# Patient Record
Sex: Female | Born: 1975 | Race: White | Hispanic: No | Marital: Married
Health system: Southern US, Community
[De-identification: ages and names within clinical notes are randomized; demographics above are authoritative.]

---

## 2020-06-29 ENCOUNTER — Other Ambulatory Visit: Payer: Self-pay

## 2020-06-29 ENCOUNTER — Ambulatory Visit: Payer: Managed Care, Other (non HMO) | Admitting: Family Medicine

## 2020-06-29 ENCOUNTER — Encounter: Payer: Self-pay | Admitting: Family Medicine

## 2020-06-29 ENCOUNTER — Ambulatory Visit: Payer: Self-pay

## 2020-06-29 VITALS — BP 120/74 | HR 83 | Ht 66.0 in | Wt 139.0 lb

## 2020-06-29 DIAGNOSIS — S82154A Nondisplaced fracture of right tibial tuberosity, initial encounter for closed fracture: Secondary | ICD-10-CM

## 2020-06-29 DIAGNOSIS — M25561 Pain in right knee: Secondary | ICD-10-CM | POA: Diagnosis not present

## 2020-06-29 DIAGNOSIS — S82151A Displaced fracture of right tibial tuberosity, initial encounter for closed fracture: Secondary | ICD-10-CM | POA: Insufficient documentation

## 2020-06-29 MED ORDER — VITAMIN D (ERGOCALCIFEROL) 1.25 MG (50000 UNIT) PO CAPS
50000.0000 [IU] | ORAL_CAPSULE | ORAL | 0 refills | Status: DC
Start: 2020-06-29 — End: 2023-10-14

## 2020-06-29 NOTE — Assessment & Plan Note (Signed)
I believe the patient does have a cortical irregularity that is more consistent with a stress reaction to the tibial tuberosity.  Once weekly vitamin D, patellar strap, decrease high impact exercises follow-up in 6 weeks to further evaluate.  Worsening pain will need to consider the possibility of MRI but I do not think that this will be likely.

## 2020-06-29 NOTE — Progress Notes (Signed)
[name] [address] [place] [address] Phone: [phone] Subjective:   Bruce Donath, am serving as a scribe for Dr. Antoine Primas. This visit occurred during the SARS-CoV-2 public health emergency.  Safety protocols were in place, including screening questions prior to the visit, additional usage of staff PPE, and extensive cleaning of exam room while observing appropriate contact time as indicated for disinfecting solutions.   I'm seeing this patient by the request  of:  Haimes, Teena Irani, MD  CC: Knee pain  [id number]  [place] Cynthia Blake is a 44 y.o. female coming in with complaint of right knee pain over tibial tuberosity. Was doing a lot of burpies and may have hit her knee on floor. Pain when she pushes on bone and squats. Patient saw another provider and was placed on celebrex. Did have a cortisone injection from another provider.  Patient states that the injection was not helpful at all.   Social History   Socioeconomic History  . Marital status: Married    Spouse name: Not on file  . Number of children: Not on file  . Years of education: Not on file  . Highest education level: Not on file  Occupational History  . Not on file  Tobacco Use  . Smoking status: Not on file  Substance and Sexual Activity  . Alcohol use: Not on file  . Drug use: Not on file  . Sexual activity: Not on file  Other Topics Concern  . Not on file  Social History Narrative  . Not on file   Social Determinants of Health   Financial Resource Strain:   . Difficulty of Paying Living Expenses:   Food Insecurity:   . Worried About Programme researcher, broadcasting/film/video in the Last Year:   . Barista in the Last Year:   Transportation Needs:   . Freight forwarder (Medical):   Marland Kitchen Lack of Transportation (Non-Medical):   Physical Activity:   . Days of Exercise per Week:   . Minutes of Exercise per Session:   Stress:   . Feeling of Stress :   Social Connections:     . Frequency of Communication with Friends and Family:   . Frequency of Social Gatherings with Friends and Family:   . Attends Religious Services:   . Active Member of Clubs or Organizations:   . Attends Banker Meetings:   Marland Kitchen Marital Status:    Not on File No family history on file.       Current Outpatient Medications (Other):  Marland Kitchen  Vitamin D, Ergocalciferol, (DRISDOL) 1.25 MG (50000 UNIT) CAPS capsule, Take 1 capsule (50,000 Units total) by mouth every 7 (seven) days.   Reviewed prior external information including notes and imaging from  primary care provider As well as notes that were available from care everywhere and other healthcare systems.  Past medical history, social, surgical and family history all reviewed in electronic medical record.  No pertanent information unless stated regarding to the chief complaint.   Review of Systems:  No headache, visual changes, nausea, vomiting, diarrhea, constipation, dizziness, abdominal pain, skin rash, fevers, chills, night sweats, weight loss, swollen lymph nodes, body aches, joint swelling, chest pain, shortness of breath, mood changes. POSITIVE muscle aches  Objective  Blood pressure 120/74, pulse 83, height 5\' 6"  (1.676 m), weight 139 lb (63 kg), SpO2 99 %.   General: No apparent distress alert and oriented x3 mood and affect normal, dressed  appropriately.  HEENT: Pupils equal, extraocular movements intact  Respiratory: Patient's speak in full sentences and does not appear short of breath  Cardiovascular: No lower extremity edema, non tender, no erythema  Neuro: Cranial nerves II through XII are intact, neurovascularly intact in all extremities with 2+ DTRs and 2+ pulses.  Gait normal with good balance and coordination.  MSK:  Non tender with full range of motion and good stability and symmetric strength and tone of shoulders, elbows, wrist, hip and ankles bilaterally.  Right knee exam shows the patient does have  some mild increase in the size of the tibial tuberosity compared to the contralateral side.  Tender to palpation in this area.  No masses appreciated.  Nontender over the patella tendon itself.  Pain with resisted extension of the knee.  Meniscus appear to be unremarkable with negative McMurray's.  Negative patellar grind test noted.  Limited musculoskeletal ultrasound was performed and interpreted by Judi Saa   Limited ultrasound of patient's knee shows that the tibial tuberosity and just proximal to it seems to have what appears to be a cortical irregularity noted that is consistent with a stress fracture.  Increasing in Doppler flow noted.  Meniscus appear to be unremarkable.  The patella tendon appears to be unremarkable. Impression and Recommendations:     The above documentation has been reviewed and is accurate and complete Judi Saa, DO       Note: This dictation was prepared with Dragon dictation along with smaller phrase technology. Any transcriptional errors that result from this process are unintentional.

## 2020-06-29 NOTE — Patient Instructions (Addendum)
Stress fracture tibial plateau  Patellar strap daily for a week then with exercises Ice after activity K2 200 mcg daily for 4 weeks Once weekly vitamin D See me again in 6 weeks

## 2020-08-15 NOTE — Progress Notes (Signed)
[name] [address] [place] [address] Phone: [phone] Subjective:   Cynthia Blake, am serving as a scribe for Dr. Antoine Primas. This visit occurred during the SARS-CoV-2 public health emergency.  Safety protocols were in place, including screening questions prior to the visit, additional usage of staff PPE, and extensive cleaning of exam room while observing appropriate contact time as indicated for disinfecting solutions.   I'm seeing this patient by the request  of:  Haimes, Teena Irani, MD  CC: Right knee pain follow-up  EGB:[id number]   8/5/20201 I believe the patient does have a cortical irregularity that is more consistent with a stress reaction to the tibial tuberosity.  Once weekly vitamin D, patellar strap, decrease high impact exercises follow-up in 6 weeks to further evaluate.  Worsening pain will need to consider the possibility of MRI but I do not think that this will be likely.  Update 08/15/2020 Cynthia Blake is a 44 y.o. female coming in with complaint of right tibial tuberosity fracture. States that her pain has improved somewhat. Has been biking and limited ROM squatting. Has been using once weekly Vit D.  Patient states about 60% better.  No pain with daily activities anymore, no pain at night.       No past medical history on file. No past surgical history on file. Social History   Socioeconomic History  . Marital status: Married    Spouse name: Not on file  . Number of children: Not on file  . Years of education: Not on file  . Highest education level: Not on file  Occupational History  . Not on file  Tobacco Use  . Smoking status: Not on file  Substance and Sexual Activity  . Alcohol use: Not on file  . Drug use: Not on file  . Sexual activity: Not on file  Other Topics Concern  . Not on file  Social History Narrative  . Not on file   Social Determinants of Health   Financial Resource Strain:   .  Difficulty of Paying Living Expenses: Not on file  Food Insecurity:   . Worried About Programme researcher, broadcasting/film/video in the Last Year: Not on file  . Ran Out of Food in the Last Year: Not on file  Transportation Needs:   . Lack of Transportation (Medical): Not on file  . Lack of Transportation (Non-Medical): Not on file  Physical Activity:   . Days of Exercise per Week: Not on file  . Minutes of Exercise per Session: Not on file  Stress:   . Feeling of Stress : Not on file  Social Connections:   . Frequency of Communication with Friends and Family: Not on file  . Frequency of Social Gatherings with Friends and Family: Not on file  . Attends Religious Services: Not on file  . Active Member of Clubs or Organizations: Not on file  . Attends Banker Meetings: Not on file  . Marital Status: Not on file   Not on File No family history on file.       Current Outpatient Medications (Other):  Marland Kitchen  Vitamin D, Ergocalciferol, (DRISDOL) 1.25 MG (50000 UNIT) CAPS capsule, Take 1 capsule (50,000 Units total) by mouth every 7 (seven) days. .  Vitamin D, Ergocalciferol, (DRISDOL) 1.25 MG (50000 UNIT) CAPS capsule, Take 1 capsule (50,000 Units total) by mouth every 7 (seven) days.   Reviewed prior external information including notes and imaging from  primary  care provider As well as notes that were available from care everywhere and other healthcare systems.  Past medical history, social, surgical and family history all reviewed in electronic medical record.  No pertanent information unless stated regarding to the chief complaint.   Review of Systems:  No headache, visual changes, nausea, vomiting, diarrhea, constipation, dizziness, abdominal pain, skin rash, fevers, chills, night sweats, weight loss, swollen lymph nodes, body aches, joint swelling, chest pain, shortness of breath, mood changes. POSITIVE muscle aches  Objective  Blood pressure 110/70, pulse 69, height 5\' 6"  (1.676 m),  weight 138 lb (62.6 kg), SpO2 99 %.   General: No apparent distress alert and oriented x3 mood and affect normal, dressed appropriately.  HEENT: Pupils equal, extraocular movements intact  Respiratory: Patient's speak in full sentences and does not appear short of breath  Cardiovascular: No lower extremity edema, non tender, no erythema  Neuro: Cranial nerves II through XII are intact, neurovascularly intact in all extremities with 2+ DTRs and 2+ pulses.  Gait normal with good balance and coordination.  MSK: Right knee exam has good stability, full range of motion.  Very minimally tender to palpation over the tibial tuberosity.  Full strength.  No pain even over the patella tendon.  Limited musculoskeletal ultrasound was performed and interpreted by  Limited ultrasound of patient's tibial tuberosity does not show the cortical: Irregularity anymore at this time.  No increase in Doppler flow.  No hypoechoic changes. Impression: Interval healing    Impression and Recommendations:     The above documentation has been reviewed and is accurate and complete Judi Saa, DO       Note: This dictation was prepared with Dragon dictation along with smaller phrase technology. Any transcriptional errors that result from this process are unintentional.

## 2020-08-16 ENCOUNTER — Ambulatory Visit: Payer: Self-pay

## 2020-08-16 ENCOUNTER — Encounter: Payer: Self-pay | Admitting: Family Medicine

## 2020-08-16 ENCOUNTER — Other Ambulatory Visit: Payer: Self-pay

## 2020-08-16 ENCOUNTER — Ambulatory Visit (INDEPENDENT_AMBULATORY_CARE_PROVIDER_SITE_OTHER): Payer: Managed Care, Other (non HMO) | Admitting: Family Medicine

## 2020-08-16 VITALS — BP 110/70 | HR 69 | Ht 66.0 in | Wt 138.0 lb

## 2020-08-16 DIAGNOSIS — S82154A Nondisplaced fracture of right tibial tuberosity, initial encounter for closed fracture: Secondary | ICD-10-CM | POA: Diagnosis not present

## 2020-08-16 MED ORDER — VITAMIN D (ERGOCALCIFEROL) 1.25 MG (50000 UNIT) PO CAPS
50000.0000 [IU] | ORAL_CAPSULE | ORAL | 0 refills | Status: DC
Start: 2020-08-16 — End: 2020-12-01

## 2020-08-16 NOTE — Patient Instructions (Addendum)
Great to see you Healing fantastic Refilled vitamin D Take it easy this week and increase slowly No box jumps ever See me again in 7-8 weeks

## 2020-08-16 NOTE — Assessment & Plan Note (Addendum)
Patient seems to have a well-healed at this time.  We will start to increase activity as tolerated.  Patient will also have a refill of the vitamin D which we will do for next stroke 3 months and then have it checked by primary care provider.  We did discuss the possibility of x-rays at this time but our x-ray tech is not in office.  We will hold on if continuing to have pain consider this as well as advanced imaging. see me again in 8 to 12 weeks

## 2020-10-06 ENCOUNTER — Ambulatory Visit: Payer: Managed Care, Other (non HMO) | Admitting: Family Medicine

## 2020-11-22 NOTE — Progress Notes (Signed)
[name] [address] [place] [address] Phone: [phone] Subjective:   Cynthia Blake, am serving as a scribe for Dr. Antoine Primas. This visit occurred during the SARS-CoV-2 public health emergency.  Safety protocols were in place, including screening questions prior to the visit, additional usage of staff PPE, and extensive cleaning of exam room while observing appropriate contact time as indicated for disinfecting solutions.   I'm seeing this patient by the request  of:  Haimes, Teena Irani, MD  CC: knee pain follow up   [id number]  Cynthia Blake is a 44 y.o. female coming in with complaint of left shoulder pain. Last seen in September for right knee pain. Patient states that she fell backwards on outstretched arm. Pain anterior. Unable to sleep at night due to pain. Patient has used IBU prn.        No past medical history on file. No past surgical history on file. Social History   Socioeconomic History  . Marital status: Married    Spouse name: Not on file  . Number of children: Not on file  . Years of education: Not on file  . Highest education level: Not on file  Occupational History  . Not on file  Tobacco Use  . Smoking status: Not on file  . Smokeless tobacco: Not on file  Substance and Sexual Activity  . Alcohol use: Not on file  . Drug use: Not on file  . Sexual activity: Not on file  Other Topics Concern  . Not on file  Social History Narrative  . Not on file   Social Determinants of Health   Financial Resource Strain: Not on file  Food Insecurity: Not on file  Transportation Needs: Not on file  Physical Activity: Not on file  Stress: Not on file  Social Connections: Not on file   Not on File No family history on file.       Current Outpatient Medications (Other):  Marland Kitchen  Vitamin D, Ergocalciferol, (DRISDOL) 1.25 MG (50000 UNIT) CAPS capsule, Take 1 capsule (50,000 Units total) by mouth every 7 (seven)  days. .  Vitamin D, Ergocalciferol, (DRISDOL) 1.25 MG (50000 UNIT) CAPS capsule, Take 1 capsule (50,000 Units total) by mouth every 7 (seven) days.   Reviewed prior external information including notes and imaging from  primary care provider As well as notes that were available from care everywhere and other healthcare systems.  Past medical history, social, surgical and family history all reviewed in electronic medical record.  No pertanent information unless stated regarding to the chief complaint.   Review of Systems:  No headache, visual changes, nausea, vomiting, diarrhea, constipation, dizziness, abdominal pain, skin rash, fevers, chills, night sweats, weight loss, swollen lymph nodes, body aches, joint swelling, chest pain, shortness of breath, mood changes. POSITIVE muscle aches  Objective  Blood pressure 114/86, pulse (!) 55, height 5\' 6"  (1.676 m), SpO2 99 %.   General: No apparent distress alert and oriented x3 mood and affect normal, dressed appropriately.  HEENT: Pupils equal, extraocular movements intact  Respiratory: Patient's speak in full sentences and does not appear short of breath  Cardiovascular: No lower extremity edema, non tender, no erythema  Left arm exam shows the patient does have very mild positive impingement.  Patient does have positive speeds test.  Mild positive crossover test.  5 out of 5 strength of the rotator cuff noted. Contralateral shoulder unremarkable  Limited musculoskeletal ultrasound was performed and interpreted by  [name]  Limited ultrasound shows the patient does have chronic changes noted to the bicep tendon.  Seems to be more acute on chronic.  No increase in Doppler flow.  Does have what appears to be more of a hypoechoic changes within the tendon that is likely secondary to scar tissue.  Unable to see the anterior labrum well but patient subscapularis does have some mild degenerative changes.  Same thing with the supraspinatus but no  true acute tear noted.  Acromioclavicular joint unremarkable. Impression: Bicep tendinitis with questionable healing partial  11941; 15 additional minutes spent for Therapeutic exercises as stated in above notes.  This included exercises focusing on stretching, strengthening, with significant focus on eccentric aspects.   Long term goals include an improvement in range of motion, strength, endurance as well as avoiding reinjury. Patient's frequency would include in 1-2 times a day, 3-5 times a week for a duration of 6-12 weeks. Shoulder Exercises that included:  Basic scapular stabilization to include adduction and depression of scapula Scaption, focusing on proper movement and good control Internal and External rotation utilizing a theraband, with elbow tucked at side entire time Rows with theraband    Proper technique shown and discussed handout in great detail with ATC.  All questions were discussed and answered.      Impression and Recommendations:     The above documentation has been reviewed and is accurate and complete Judi Saa, DO

## 2020-11-23 ENCOUNTER — Ambulatory Visit: Payer: Managed Care, Other (non HMO) | Admitting: Family Medicine

## 2020-11-23 ENCOUNTER — Other Ambulatory Visit: Payer: Self-pay

## 2020-11-23 ENCOUNTER — Encounter: Payer: Self-pay | Admitting: Family Medicine

## 2020-11-23 ENCOUNTER — Ambulatory Visit: Payer: Self-pay

## 2020-11-23 VITALS — BP 114/86 | HR 55 | Ht 66.0 in

## 2020-11-23 DIAGNOSIS — M25512 Pain in left shoulder: Secondary | ICD-10-CM

## 2020-11-23 DIAGNOSIS — M7522 Bicipital tendinitis, left shoulder: Secondary | ICD-10-CM | POA: Insufficient documentation

## 2020-11-23 NOTE — Assessment & Plan Note (Signed)
Patient has signs and symptoms consistent with a potential partial tear of the bicep tendon and seems to be resolving.  Does have scar tissue formation.  Home exercises given.  Patient does do CrossFit and encouraged her to decrease the range of motion.  We discussed compression sleeve that can be beneficial.  Work with Event organiser to learn home exercises.  Follow-up with me again 6 weeks.  Worsening pain consider formal physical therapy or advanced imaging is warranted

## 2020-11-23 NOTE — Patient Instructions (Signed)
Arm compression sleeve Ice 20 min 2x a day Voltaren gel 2x a day Hands within peripheral vision See me again in 6-7 weeks

## 2020-12-01 ENCOUNTER — Other Ambulatory Visit: Payer: Self-pay | Admitting: Family Medicine

## 2021-01-04 NOTE — Progress Notes (Deleted)
[facility] [address] [place] [address] Phone: [phone] Subjective:    I'm seeing this patient by the request  of:  Haimes, Teena Irani, MD  CC:   [id number]   11/23/2020 Patient has signs and symptoms consistent with a potential partial tear of the bicep tendon and seems to be resolving.  Does have scar tissue formation.  Home exercises given.  Patient does do CrossFit and encouraged her to decrease the range of motion.  We discussed compression sleeve that can be beneficial.  Work with Event organiser to learn home exercises.  Follow-up with me again 6 weeks.  Worsening pain consider formal physical therapy or advanced imaging is warranted  Update 01/04/2021 Cynthia Blake is a 45 y.o. female coming in with complaint of left shoulder pain. Patient continuing Rx Vit D. Patient states  Onset-  Location Duration-  Character- Aggravating factors- Reliving factors-  Therapies tried-  Severity-     No past medical history on file. No past surgical history on file. Social History   Socioeconomic History  . Marital status: Married    Spouse name: Not on file  . Number of children: Not on file  . Years of education: Not on file  . Highest education level: Not on file  Occupational History  . Not on file  Tobacco Use  . Smoking status: Not on file  . Smokeless tobacco: Not on file  Substance and Sexual Activity  . Alcohol use: Not on file  . Drug use: Not on file  . Sexual activity: Not on file  Other Topics Concern  . Not on file  Social History Narrative  . Not on file   Social Determinants of Health   Financial Resource Strain: Not on file  Food Insecurity: Not on file  Transportation Needs: Not on file  Physical Activity: Not on file  Stress: Not on file  Social Connections: Not on file   Not on File No family history on file.       Current Outpatient Medications (Other):  Marland Kitchen  Vitamin D, Ergocalciferol, (DRISDOL)  1.25 MG (50000 UNIT) CAPS capsule, Take 1 capsule (50,000 Units total) by mouth every 7 (seven) days. .  Vitamin D, Ergocalciferol, (DRISDOL) 1.25 MG (50000 UNIT) CAPS capsule, TAKE 1 CAPSULE BY MOUTH EVERY 7 DAYS   Reviewed prior external information including notes and imaging from  primary care provider As well as notes that were available from care everywhere and other healthcare systems.  Past medical history, social, surgical and family history all reviewed in electronic medical record.  No pertanent information unless stated regarding to the chief complaint.   Review of Systems:  No headache, visual changes, nausea, vomiting, diarrhea, constipation, dizziness, abdominal pain, skin rash, fevers, chills, night sweats, weight loss, swollen lymph nodes, body aches, joint swelling, chest pain, shortness of breath, mood changes. POSITIVE muscle aches  Objective  There were no vitals taken for this visit.   General: No apparent distress alert and oriented x3 mood and affect normal, dressed appropriately.  HEENT: Pupils equal, extraocular movements intact  Respiratory: Patient's speak in full sentences and does not appear short of breath  Cardiovascular: No lower extremity edema, non tender, no erythema  Gait normal with good balance and coordination.  MSK:  Non tender with full range of motion and good stability and symmetric strength and tone of shoulders, elbows, wrist, hip, knee and ankles bilaterally.     Impression and Recommendations:  The above documentation has been reviewed and is accurate and complete Cynthia Blake

## 2021-01-09 ENCOUNTER — Ambulatory Visit: Payer: Managed Care, Other (non HMO) | Admitting: Family Medicine

## 2022-01-22 NOTE — Progress Notes (Signed)
?Charlann Boxer D.O. ?[facility] ?[place] ?Phone: [phone] ?Subjective:   ?I, Vilma Meckel, am serving as a Education administrator for Dr. Hulan Saas. ?This visit occurred during the SARS-CoV-2 public health emergency.  Safety protocols were in place, including screening questions prior to the visit, additional usage of staff PPE, and extensive cleaning of exam room while observing appropriate contact time as indicated for disinfecting solutions.  ? ?I'm seeing this patient by the request  of:  Haimes, Youlanda Roys, MD ? ?CC: Patient does have discomfort of the hips ? ?[id number]  ?Adlen Alpert is a 46 y.o. female coming in with complaint of R hip flexor. Seen in 2021 for shoulder pain. Patient states for the last 4 months has had pain in groin. Hip flexion hurts wakes her up at night. Radiates down leg sometimes. Has tried various interventions. Ibuprofen does help but doesn't want to be dependent on it.  ? ? ? ?  ? ?No past medical history on file. ?No past surgical history on file. ?Social History  ? ?Socioeconomic History  ? Marital status: Married  ?  Spouse name: Not on file  ? Number of children: Not on file  ? Years of education: Not on file  ? Highest education level: Not on file  ?Occupational History  ? Not on file  ?Tobacco Use  ? Smoking status: Not on file  ? Smokeless tobacco: Not on file  ?Substance and Sexual Activity  ? Alcohol use: Not on file  ? Drug use: Not on file  ? Sexual activity: Not on file  ?Other Topics Concern  ? Not on file  ?Social History Narrative  ? Not on file  ? ?Social Determinants of Health  ? ?Financial Resource Strain: Not on file  ?Food Insecurity: Not on file  ?Transportation Needs: Not on file  ?Physical Activity: Not on file  ?Stress: Not on file  ?Social Connections: Not on file  ? ?Not on File ?No family history on file. ? ? ? ? ? ? ?Current Outpatient Medications (Other):  ?  tiZANidine (ZANAFLEX) 2 MG tablet, Take 1 tablet (2 mg total) by  mouth at bedtime. ?  Vitamin D, Ergocalciferol, (DRISDOL) 1.25 MG (50000 UNIT) CAPS capsule, Take 1 capsule (50,000 Units total) by mouth every 7 (seven) days. ?  Vitamin D, Ergocalciferol, (DRISDOL) 1.25 MG (50000 UNIT) CAPS capsule, TAKE 1 CAPSULE BY MOUTH EVERY 7 DAYS ? ? ?Reviewed prior external information including notes and imaging from  ?primary care provider ?As well as notes that were available from care everywhere and other healthcare systems. ? ?Past medical history, social, surgical and family history all reviewed in electronic medical record.  No pertanent information unless stated regarding to the chief complaint.  ? ?Review of Systems: ? No headache, visual changes, nausea, vomiting, diarrhea, constipation, dizziness, abdominal pain, skin rash, fevers, chills, night sweats, weight loss, swollen lymph nodes, body aches, joint swelling, chest pain, shortness of breath, mood changes. POSITIVE muscle aches ? ?Objective  ?Blood pressure 106/62, pulse 76, height 5\' 6"  (1.676 m), weight 143 lb (64.9 kg), SpO2 99 %. ?  ?General: No apparent distress alert and oriented x3 mood and affect normal, dressed appropriately.  ?HEENT: Pupils equal, extraocular movements intact  ?Respiratory: Patient's speak in full sentences and does not appear short of breath  ?Cardiovascular: No lower extremity edema, non tender, no erythema  ?Gait mild antalgic favoring the right hip ?Right hip exam does have some decreased range of  motion in both flexion and extension as well as internal and external range of motion.  Patient's strength does seem to be currently compared to the contralateral side.  Mild positive fulcrum test ? ?Limited muscular skeletal ultrasound was performed and interpreted by Hulan Saas, M  ?Limited ultrasound of patient's right shoulder may be some narrowing noted of the hip  joint patient also has what appears to be some potential calcific changes noted.   ? ?  ?Impression and Recommendations:  ?   ? ?The above documentation has been reviewed and is accurate and complete Lyndal Pulley, DO ? ? ? ?

## 2022-01-24 ENCOUNTER — Encounter: Payer: Self-pay | Admitting: Family Medicine

## 2022-01-24 ENCOUNTER — Ambulatory Visit: Payer: Self-pay

## 2022-01-24 ENCOUNTER — Other Ambulatory Visit: Payer: Self-pay

## 2022-01-24 ENCOUNTER — Ambulatory Visit (INDEPENDENT_AMBULATORY_CARE_PROVIDER_SITE_OTHER): Payer: 59 | Admitting: Family Medicine

## 2022-01-24 ENCOUNTER — Ambulatory Visit (INDEPENDENT_AMBULATORY_CARE_PROVIDER_SITE_OTHER): Payer: 59

## 2022-01-24 VITALS — BP 106/62 | HR 76 | Ht 66.0 in | Wt 143.0 lb

## 2022-01-24 DIAGNOSIS — M25551 Pain in right hip: Secondary | ICD-10-CM | POA: Insufficient documentation

## 2022-01-24 MED ORDER — TIZANIDINE HCL 2 MG PO TABS: 2.0000 mg | ORAL_TABLET | Freq: Every day | ORAL | 0 refills | Status: DC

## 2022-01-24 NOTE — Assessment & Plan Note (Signed)
Patient does have right knee pain, does seem to be out of proportion.  Does have significant limited range of motion from the navicular listed in this active.  X-rays were independently visualized by me with questionable cam deformity noted.  This could be contributing to more of an impingement syndrome.  Concern also for possible labral pathology with the calcific changes noted on the ultrasound.  Due to the severity of the pain and the fact and pain at night I do feel advanced imaging is warranted at this time.  This is affecting all stages of life including ambulation.  Depending on findings we will discuss with further management if it can be conservative or need surgical intervention.  Patient previously did have a fracture of the right tibial tuberosity as well and may need to consider early bone density testing. ?

## 2022-01-24 NOTE — Patient Instructions (Addendum)
[facility] [phone] ?Call Today ? ?When we receive your results we will contact you. ? ?If not financially possibly we will start Prednisone ?Zanaflex 2mg  for sleep and pain ?See you again in 6 weeks just in case ?

## 2022-02-06 ENCOUNTER — Other Ambulatory Visit: Payer: Self-pay

## 2022-02-06 MED ORDER — PREDNISONE 20 MG PO TABS: 40.0000 mg | ORAL_TABLET | Freq: Every day | ORAL | 0 refills | Status: DC

## 2022-02-18 ENCOUNTER — Other Ambulatory Visit: Payer: 59

## 2022-02-20 ENCOUNTER — Other Ambulatory Visit: Payer: Self-pay | Admitting: Family Medicine

## 2023-03-12 IMAGING — DX DG HIP (WITH OR WITHOUT PELVIS) 2-3V*R*
3 series · 3 of 3 positions shown · non-contrast
Comparison: None.

CLINICAL DATA: Right hip pain.

EXAM:
DG HIP (WITH OR WITHOUT PELVIS) 2-3V RIGHT

[pelvis ap]
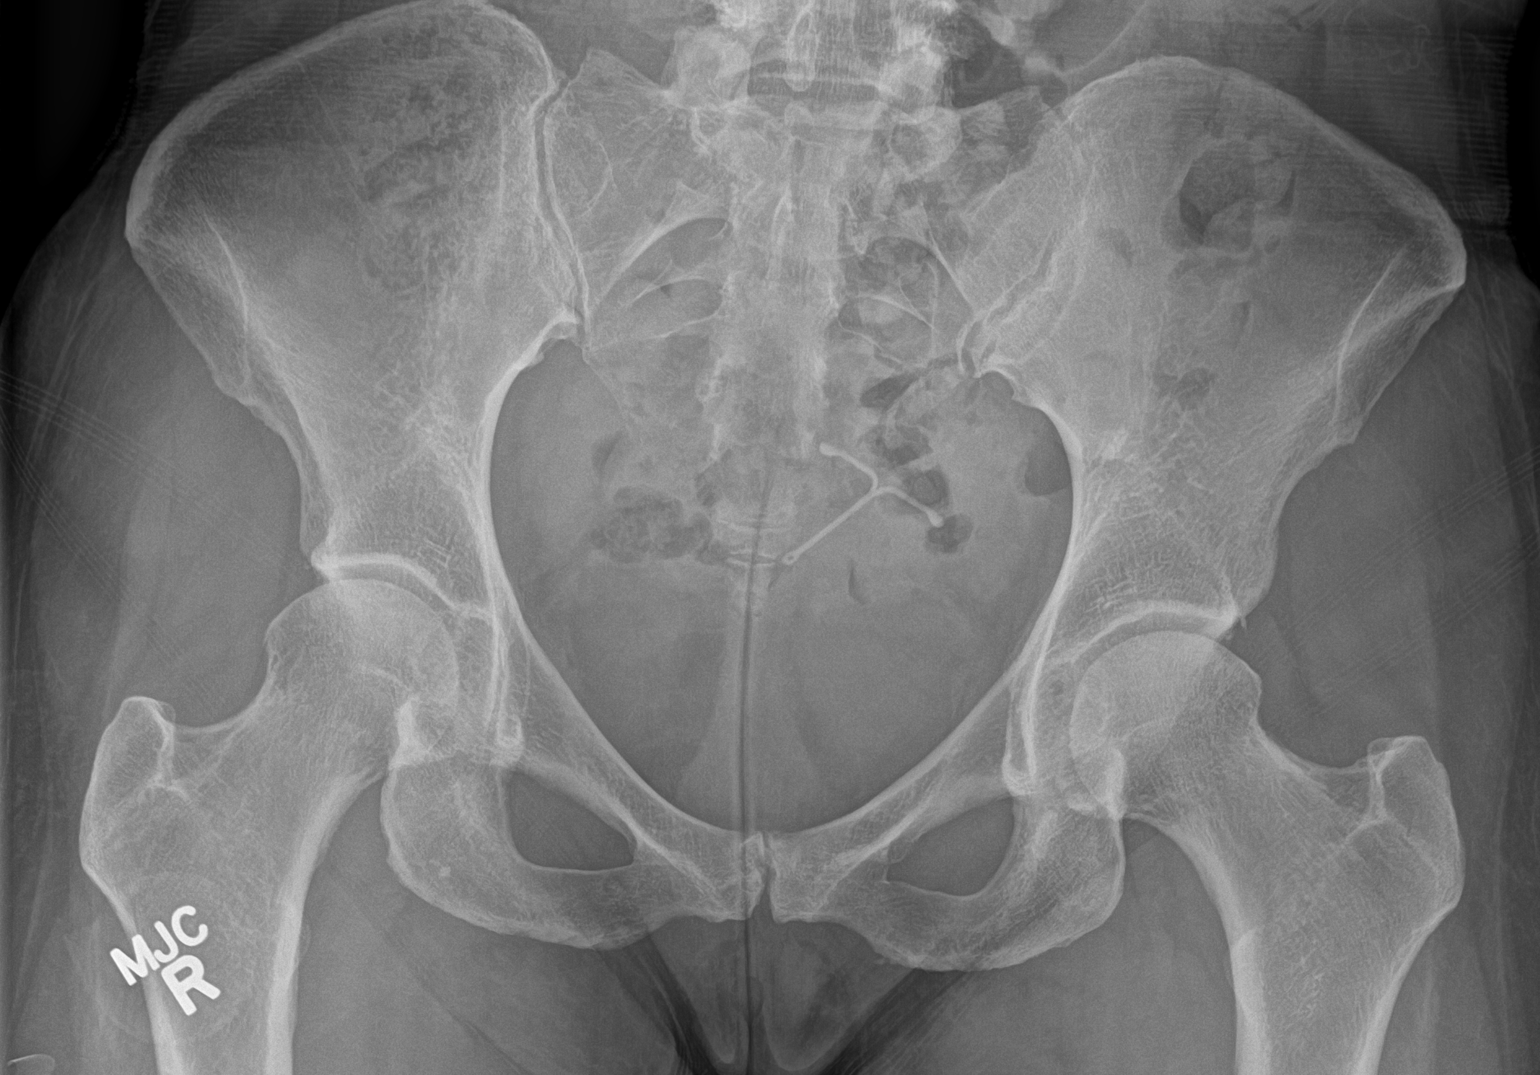

[hip ap]
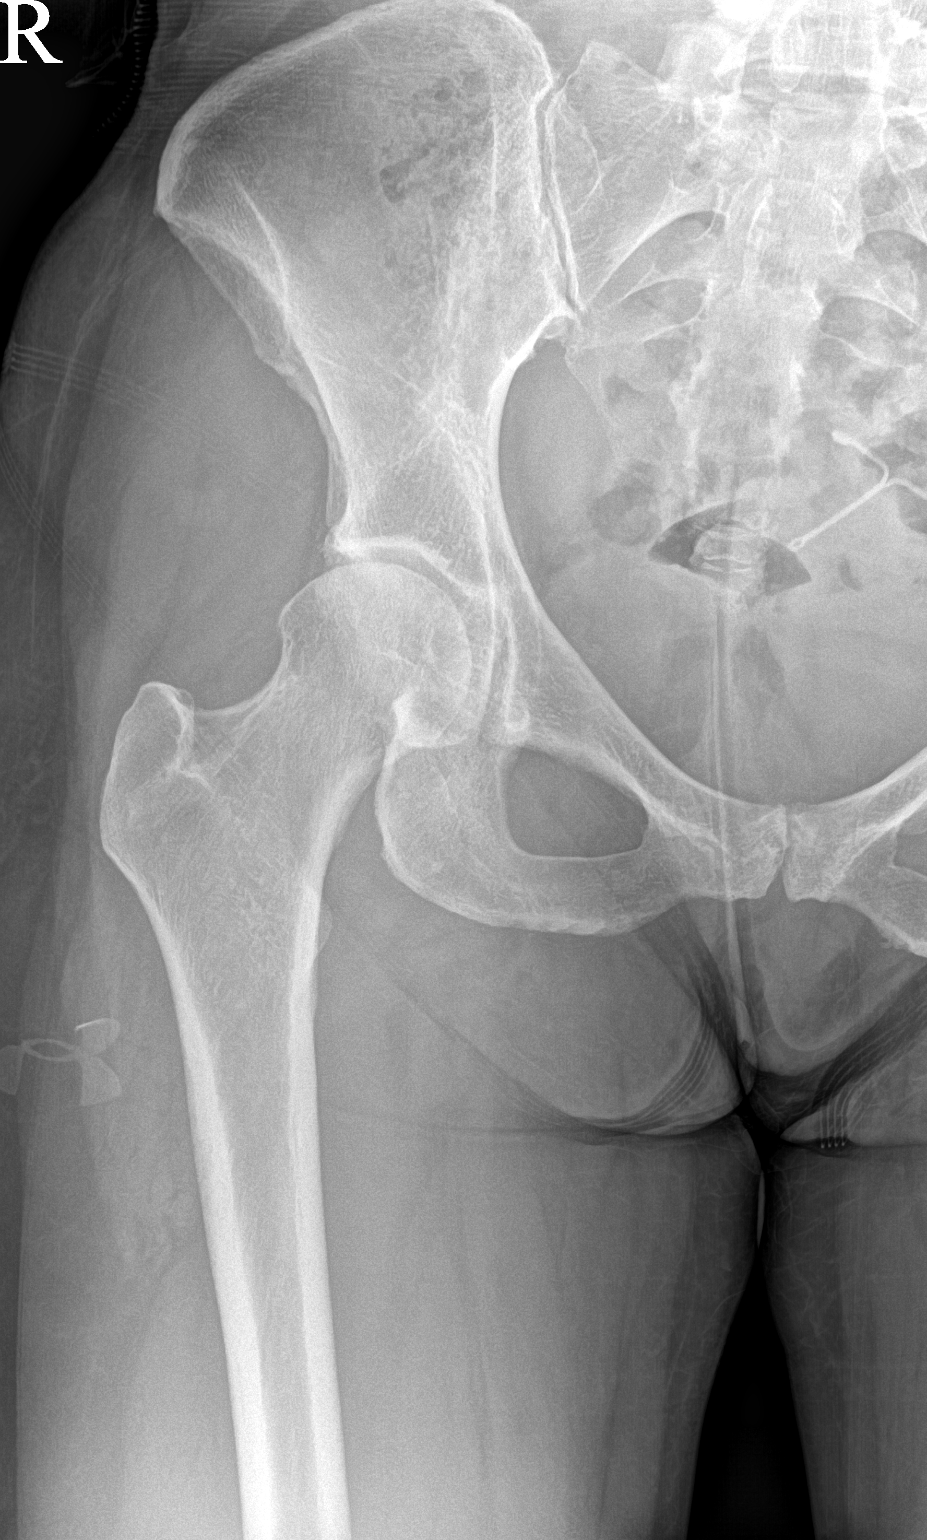

[hip frog leg]
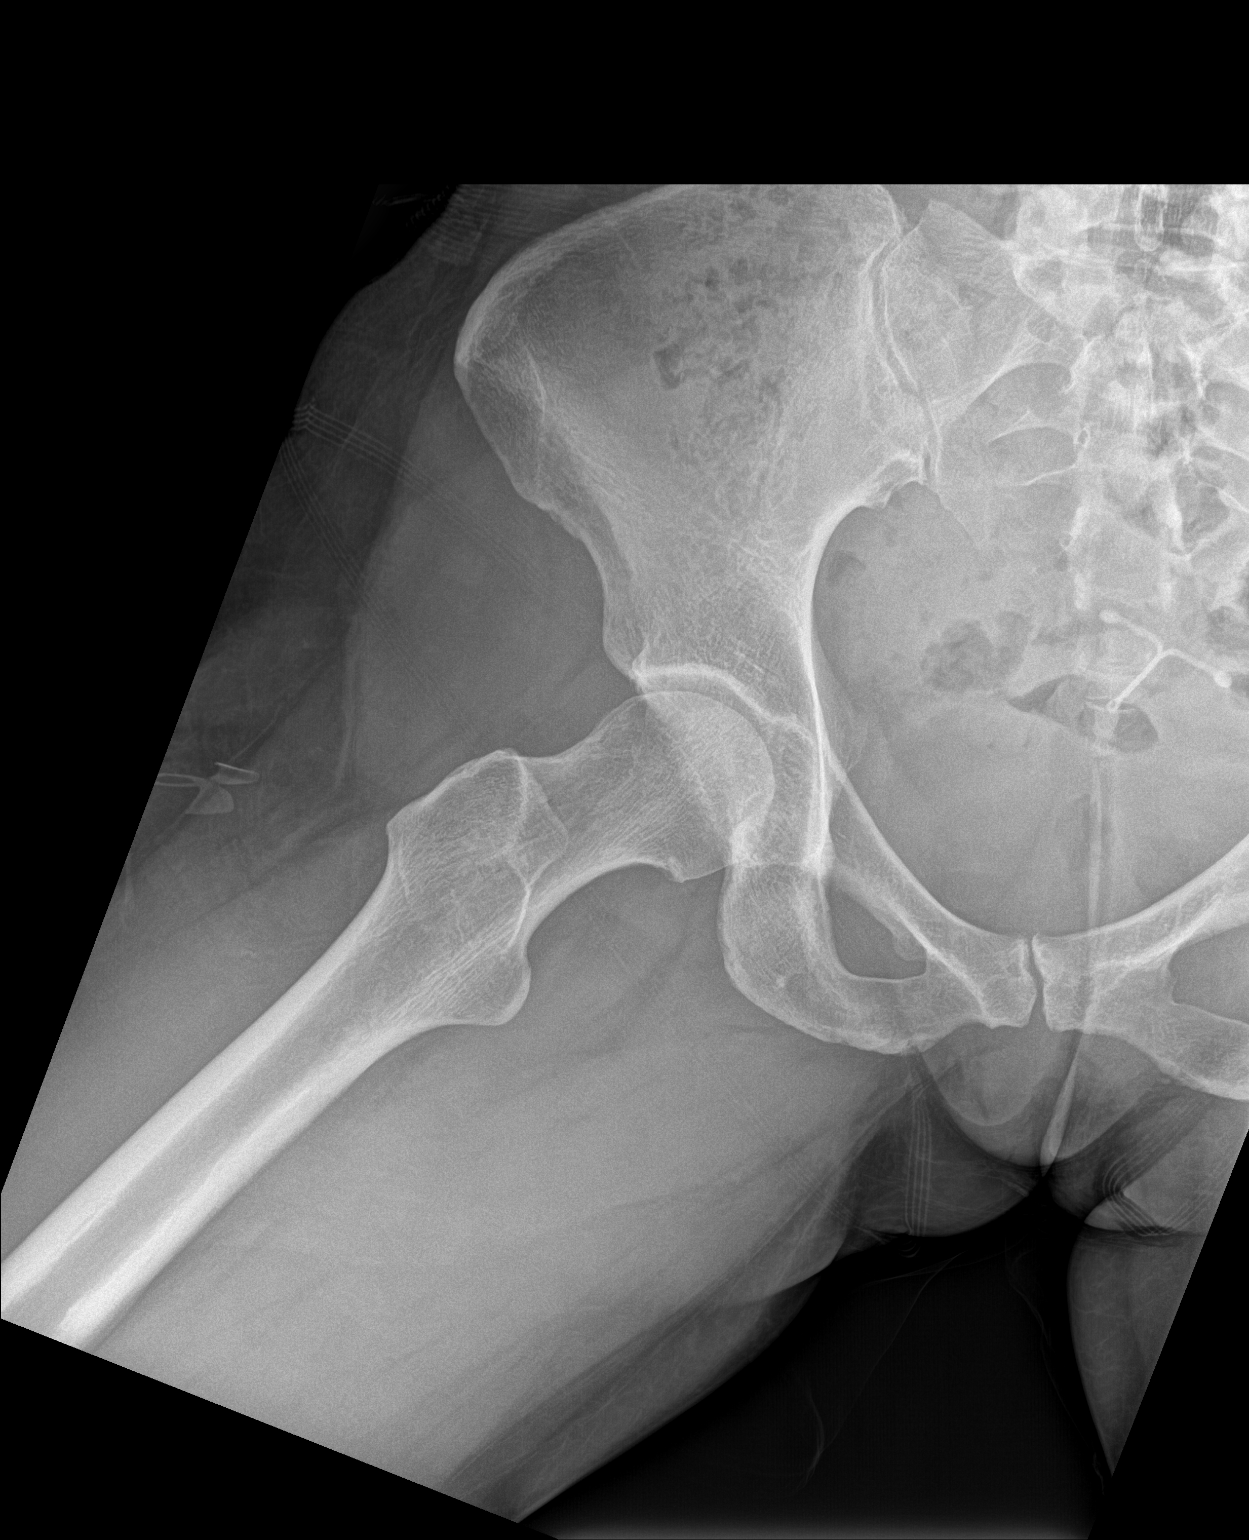

[3 of 3 positions shown; findings below may reference images not displayed]

FINDINGS: There is no evidence of hip fracture or dislocation. There is no
evidence of arthropathy or other focal bone abnormality. IUD is
present.
IMPRESSION: Negative.

## 2023-10-13 NOTE — Progress Notes (Unsigned)
    Aleen Sells D.[facility] [address] [place] [address] Phone: [phone]   Assessment and Plan:     There are no diagnoses linked to this encounter.  ***   Pertinent previous records reviewed include ***    Follow Up: ***     Subjective:   I, Cynthia Blake, am serving as a Neurosurgeon for Doctor Richardean Sale  Chief Complaint: right elbow pain   HPI:   10/14/2023 Patient is a 47 year old female with concerns of right elbow pain. Patient states  Relevant Historical Information: ***  Additional pertinent review of systems negative.   Current Outpatient Medications:    predniSONE (DELTASONE) 20 MG tablet, Take 2 tablets (40 mg total) by mouth daily with breakfast., Disp: 10 tablet, Rfl: 0   tiZANidine (ZANAFLEX) 2 MG tablet, TAKE 1 TABLET(2 MG) BY MOUTH AT BEDTIME, Disp: 30 tablet, Rfl: 0   Vitamin D, Ergocalciferol, (DRISDOL) 1.25 MG (50000 UNIT) CAPS capsule, Take 1 capsule (50,000 Units total) by mouth every 7 (seven) days., Disp: 12 capsule, Rfl: 0   Vitamin D, Ergocalciferol, (DRISDOL) 1.25 MG (50000 UNIT) CAPS capsule, TAKE 1 CAPSULE BY MOUTH EVERY 7 DAYS, Disp: 12 capsule, Rfl: 0   Objective:     There were no vitals filed for this visit.    There is no height or weight on file to calculate BMI.    Physical Exam:    ***   Electronically signed by:  Aleen Sells D.[facility] 12:29 PM 10/13/23

## 2023-10-14 ENCOUNTER — Ambulatory Visit (INDEPENDENT_AMBULATORY_CARE_PROVIDER_SITE_OTHER): Payer: 59 | Admitting: Sports Medicine

## 2023-10-14 VITALS — BP 120/84 | HR 55 | Ht 66.0 in | Wt 149.0 lb

## 2023-10-14 DIAGNOSIS — M25521 Pain in right elbow: Secondary | ICD-10-CM

## 2023-10-14 DIAGNOSIS — M7711 Lateral epicondylitis, right elbow: Secondary | ICD-10-CM

## 2023-10-14 MED ORDER — MELOXICAM 15 MG PO TABS: 15.0000 mg | ORAL_TABLET | Freq: Every day | ORAL | 0 refills | Status: AC

## 2023-10-14 NOTE — Patient Instructions (Signed)
Elbow HEP  - Start meloxicam 15 mg daily x2 weeks.  If still having pain after 2 weeks, complete 3rd-week of meloxicam. May use remaining meloxicam as needed once daily for pain control.  Do not to use additional NSAIDs while taking meloxicam.  May use Tylenol [id number] mg 2 to 3 times a day for breakthrough pain. Recommend getting a wrist brace with no thumb  4 week follow up

## 2023-10-22 ENCOUNTER — Other Ambulatory Visit: Payer: Self-pay | Admitting: Medical Genetics

## 2023-10-29 ENCOUNTER — Other Ambulatory Visit: Payer: Self-pay | Admitting: Medical Genetics

## 2023-11-10 ENCOUNTER — Other Ambulatory Visit: Payer: Self-pay | Admitting: Sports Medicine

## 2023-11-24 ENCOUNTER — Other Ambulatory Visit (HOSPITAL_COMMUNITY): Payer: 59

## 2023-11-24 ENCOUNTER — Other Ambulatory Visit (HOSPITAL_COMMUNITY)
Admission: RE | Admit: 2023-11-24 | Discharge: 2023-11-24 | Disposition: A | Discharge status: Home / Self Care | Payer: Self-pay | Source: Ambulatory Visit | Attending: Oncology | Admitting: Oncology

## 2023-12-06 LAB — GENECONNECT MOLECULAR SCREEN: Genetic Analysis Overall Interpretation: NEGATIVE
